# Patient Record
Sex: Female | Born: 1995 | Race: White | Hispanic: No | Marital: Single | Smoking: Never smoker
Health system: Southern US, Community
[De-identification: ages and names within clinical notes are randomized; demographics above are authoritative.]

## PROBLEM LIST (undated history)

## (undated) DIAGNOSIS — F319 Bipolar disorder, unspecified: Secondary | ICD-10-CM

## (undated) DIAGNOSIS — F329 Major depressive disorder, single episode, unspecified: Secondary | ICD-10-CM

## (undated) DIAGNOSIS — F32A Depression, unspecified: Secondary | ICD-10-CM

## (undated) DIAGNOSIS — F419 Anxiety disorder, unspecified: Secondary | ICD-10-CM

## (undated) HISTORY — PX: CHOLECYSTECTOMY: SHX55

## (undated) HISTORY — PX: TONSILLECTOMY: SUR1361

---
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
## 2019-10-18 ENCOUNTER — Encounter (HOSPITAL_COMMUNITY): Payer: Self-pay | Admitting: Emergency Medicine

## 2019-10-18 ENCOUNTER — Emergency Department (HOSPITAL_COMMUNITY): Payer: Medicaid Other

## 2019-10-18 ENCOUNTER — Emergency Department (HOSPITAL_COMMUNITY)
Admission: EM | Admit: 2019-10-18 | Discharge: 2019-10-19 | Disposition: A | Discharge status: Home / Self Care | Payer: Medicaid Other | Attending: Emergency Medicine | Admitting: Emergency Medicine

## 2019-10-18 ENCOUNTER — Other Ambulatory Visit: Payer: Self-pay

## 2019-10-18 DIAGNOSIS — Z20822 Contact with and (suspected) exposure to covid-19: Secondary | ICD-10-CM | POA: Insufficient documentation

## 2019-10-18 DIAGNOSIS — R079 Chest pain, unspecified: Secondary | ICD-10-CM | POA: Insufficient documentation

## 2019-10-18 DIAGNOSIS — R1084 Generalized abdominal pain: Secondary | ICD-10-CM | POA: Diagnosis present

## 2019-10-18 DIAGNOSIS — Z3201 Encounter for pregnancy test, result positive: Secondary | ICD-10-CM | POA: Diagnosis not present

## 2019-10-18 DIAGNOSIS — Z3490 Encounter for supervision of normal pregnancy, unspecified, unspecified trimester: Secondary | ICD-10-CM

## 2019-10-18 HISTORY — DX: Anxiety disorder, unspecified: F41.9

## 2019-10-18 HISTORY — DX: Bipolar disorder, unspecified: F31.9

## 2019-10-18 HISTORY — DX: Depression, unspecified: F32.A

## 2019-10-18 LAB — URINALYSIS, ROUTINE W REFLEX MICROSCOPIC
Bacteria, UA: NONE SEEN
Bilirubin Urine: NEGATIVE
Glucose, UA: NEGATIVE mg/dL
Hgb urine dipstick: NEGATIVE
Ketones, ur: NEGATIVE mg/dL
Nitrite: NEGATIVE
Protein, ur: NEGATIVE mg/dL
Specific Gravity, Urine: 1.016 (ref 1.005–1.030)
pH: 5 (ref 5.0–8.0)

## 2019-10-18 LAB — COMPREHENSIVE METABOLIC PANEL
ALT: 28 U/L (ref 0–44)
AST: 25 U/L (ref 15–41)
Albumin: 4.3 g/dL (ref 3.5–5.0)
Alkaline Phosphatase: 52 U/L (ref 38–126)
Anion gap: 11 (ref 5–15)
BUN: 9 mg/dL (ref 6–20)
CO2: 23 mmol/L (ref 22–32)
Calcium: 9.5 mg/dL (ref 8.9–10.3)
Chloride: 103 mmol/L (ref 98–111)
Creatinine, Ser: 0.82 mg/dL (ref 0.44–1.00)
GFR calc Af Amer: >60 mL/min (ref >60)
GFR calc non Af Amer: >60 mL/min (ref >60)
Glucose, Bld: 98 mg/dL (ref 70–99)
Potassium: 3.4 mmol/L — ABNORMAL LOW (ref 3.5–5.1)
Sodium: 137 mmol/L (ref 135–145)
Total Bilirubin: 0.6 mg/dL (ref 0.3–1.2)
Total Protein: 7.3 g/dL (ref 6.5–8.1)

## 2019-10-18 LAB — CBC
HCT: 42.5 % (ref 36.0–46.0)
Hemoglobin: 14 g/dL (ref 12.0–15.0)
MCH: 30.5 pg (ref 26.0–34.0)
MCHC: 32.9 g/dL (ref 30.0–36.0)
MCV: 92.6 fL (ref 80.0–100.0)
Platelets: 354 10*3/uL (ref 150–400)
RBC: 4.59 MIL/uL (ref 3.87–5.11)
RDW: 12.4 % (ref 11.5–15.5)
WBC: 12.4 10*3/uL — ABNORMAL HIGH (ref 4.0–10.5)
nRBC: 0 % (ref 0.0–0.2)

## 2019-10-18 LAB — TROPONIN I (HIGH SENSITIVITY)
Troponin I (High Sensitivity): 3 ng/L (ref <18)
Troponin I (High Sensitivity): <2 ng/L (ref <18)

## 2019-10-18 LAB — I-STAT BETA HCG BLOOD, ED (MC, WL, AP ONLY): I-stat hCG, quantitative: 57.4 m[IU]/mL — ABNORMAL HIGH (ref <5)

## 2019-10-18 LAB — LIPASE, BLOOD: Lipase: 28 U/L (ref 11–51)

## 2019-10-18 MED ORDER — SODIUM CHLORIDE 0.9% FLUSH: 3.0000 mL | Freq: Once | INTRAVENOUS | Status: DC

## 2019-10-18 NOTE — ED Triage Notes (Signed)
Patient reports central chest pain , mid abdominal pain , right low back pain , emesis , diarrhea and headache onset yesterday , denies SOB , no cough or fever .

## 2019-10-19 ENCOUNTER — Emergency Department (HOSPITAL_COMMUNITY): Payer: Medicaid Other

## 2019-10-19 LAB — HCG, QUANTITATIVE, PREGNANCY: hCG, Beta Chain, Quant, S: 69 m[IU]/mL — ABNORMAL HIGH (ref <5)

## 2019-10-19 LAB — POC SARS CORONAVIRUS 2 AG -  ED: SARS Coronavirus 2 Ag: NEGATIVE

## 2019-10-19 MED ORDER — SODIUM CHLORIDE 0.9 % IV BOLUS
1000.0000 mL | Freq: Once | INTRAVENOUS | Status: AC
  Administered 2019-10-19: 1000 mL via INTRAVENOUS

## 2019-10-19 MED ORDER — ACETAMINOPHEN 500 MG PO TABS
1000.0000 mg | ORAL_TABLET | Freq: Once | ORAL | Status: AC
  Administered 2019-10-19: 1000 mg via ORAL
  Filled 2019-10-19: qty 2

## 2019-10-19 NOTE — Discharge Instructions (Signed)
1. Medications: usual home medications, tylenol for body aches and fever control 2. Treatment: rest, drink plenty of fluids, 3. Follow Up: Please followup with your OB/GYN or the [facility] in 48 hours for repeat bloodwork; Please return to the ER within 24 hours if symptoms are not improving, your symptoms worsen in any way, you develop vaginal bleeding, persistent fevers, vomiting or other concerns.

## 2019-10-19 NOTE — ED Provider Notes (Signed)
[facility] EMERGENCY DEPARTMENT Provider Note   CSN: [id number] Arrival date & time: 10/18/19  1956     History Chief Complaint  Patient presents with  . Chest Pain  . Abdominal Pain    Victoria Salas is a 24 y.o. female with a hx of anxiety, bipolar disorder, depression presents to the Emergency Department complaining of gradual, persistent, progressively worsening generalized abd pain onset 4 days ago.  She reports the pain is severe, sharp and radiates into her right back.  Associated symptoms include low back pain, shortness of breath, headache, cough, chills.  She also reports a handful of episodes of nausea vomiting and diarrhea.  She reports emesis is nonbloody and nonbilious.  No melena or hematochezia.  Pt reports subjective fever at home up to 100.0 at home.  Pt reports taking Tylenol without relief.  Nothing makes her symptoms better or worse.  She denies known Covid contacts.  She has been tolerating p.o. at home without difficulty.  LMP: Dec7th.  Pt reports she previously had an IUD but it was removed on Dec 12th.  She reports she is sexually active without condom usage or other birth control.  She also reports missing her period in January.  Patient reports her primary concern is her abdominal pain.  She has no history of ectopic pregnancy.  Patient reports she is sexually active with one female partner and has no concern for STDs.  She denies vaginal discharge or vaginal bleeding.  Patient does have a history of cholecystectomy and tonsillectomy.  She denies neck pain, neck stiffness, weakness, dizziness, syncope, dysuria, hematuria, vaginal bleeding.   The history is provided by the patient and medical records. No language interpreter was used.       Past Medical History:  Diagnosis Date  . Anxiety   . Bipolar 1 disorder ([place])   . Depression     There are no problems to display for this patient.   Past Surgical History:  Procedure  Laterality Date  . CHOLECYSTECTOMY    . TONSILLECTOMY       OB History   No obstetric history on file.     No family history on file.  Social History   Tobacco Use  . Smoking status: Never Smoker  . Smokeless tobacco: Never Used  Substance Use Topics  . Alcohol use: Never  . Drug use: Never    Home Medications Prior to Admission medications   Not on File    Allergies    Patient has no known allergies.  Review of Systems   Review of Systems  Constitutional: Positive for appetite change, chills, fatigue and fever. Negative for diaphoresis and unexpected weight change.  HENT: Negative for mouth sores.   Eyes: Negative for visual disturbance.  Respiratory: Positive for cough and shortness of breath. Negative for chest tightness and wheezing.   Cardiovascular: Negative for chest pain.  Gastrointestinal: Positive for abdominal pain. Negative for constipation, diarrhea, nausea and vomiting.  Endocrine: Negative for polydipsia, polyphagia and polyuria.  Genitourinary: Negative for dysuria, frequency, hematuria and urgency.  Musculoskeletal: Positive for back pain. Negative for neck stiffness.  Skin: Negative for rash.  Allergic/Immunologic: Negative for immunocompromised state.  Neurological: Positive for headaches. Negative for syncope and light-headedness.  Hematological: Does not bruise/bleed easily.  Psychiatric/Behavioral: Negative for sleep disturbance. The patient is not nervous/anxious.     Physical Exam Updated Vital Signs BP 133/80   Pulse 99   Temp 98.4 F (36.9 C) (Oral)  Resp 19   LMP 08/28/2019 (Approximate)   SpO2 97%   Physical Exam Vitals and nursing note reviewed.  Constitutional:      General: She is not in acute distress.    Appearance: She is not diaphoretic.  HENT:     Head: Normocephalic.  Eyes:     General: No scleral icterus.    Conjunctiva/sclera: Conjunctivae normal.  Cardiovascular:     Rate and Rhythm: Regular rhythm.  Tachycardia present.     Pulses: Normal pulses.          Radial pulses are 2+ on the right side and 2+ on the left side.     Comments: Mild tachycardia Pulmonary:     Effort: No tachypnea, accessory muscle usage, prolonged expiration, respiratory distress or retractions.     Breath sounds: No stridor.     Comments: Equal chest rise. No increased work of breathing. Abdominal:     General: There is no distension.     Palpations: Abdomen is soft.     Tenderness: There is generalized abdominal tenderness. There is no right CVA tenderness, left CVA tenderness, guarding or rebound.  Musculoskeletal:     Cervical back: Normal range of motion.     Comments: Moves all extremities equally and without difficulty.  Skin:    General: Skin is warm and dry.     Capillary Refill: Capillary refill takes less than 2 seconds.     Comments: Warm to touch  Neurological:     Mental Status: She is alert.     GCS: GCS eye subscore is 4. GCS verbal subscore is 5. GCS motor subscore is 6.     Comments: Speech is clear and goal oriented.  Psychiatric:        Mood and Affect: Mood normal.     ED Results / Procedures / Treatments   Labs (all labs ordered are listed, but only abnormal results are displayed) Labs Reviewed  COMPREHENSIVE METABOLIC PANEL - Abnormal; Notable for the following components:      Result Value   Potassium 3.4 (*)    All other components within normal limits  CBC - Abnormal; Notable for the following components:   WBC 12.4 (*)    All other components within normal limits  URINALYSIS, ROUTINE W REFLEX MICROSCOPIC - Abnormal; Notable for the following components:   Leukocytes,Ua TRACE (*)    All other components within normal limits  I-STAT BETA HCG BLOOD, ED ([facility], [facility], [facility] ONLY) - Abnormal; Notable for the following components:   I-stat hCG, quantitative 57.4 (*)    All other components within normal limits  NOVEL CORONAVIRUS, NAA (HOSP ORDER, SEND-OUT TO REF LAB; TAT 18-24 HRS)    LIPASE, BLOOD  HCG, QUANTITATIVE, PREGNANCY  POC SARS CORONAVIRUS 2 AG -  ED  TROPONIN I (HIGH SENSITIVITY)  TROPONIN I (HIGH SENSITIVITY)    EKG EKG Interpretation  Date/Time:  Wednesday October 18 2019 20:05:52 EST Ventricular Rate:  116 PR Interval:  136 QRS Duration: 84 QT Interval:  308 QTC Calculation: 428 R Axis:   79 Text Interpretation: Sinus tachycardia Otherwise normal ECG No old tracing to compare Confirmed by Dione Booze ([id number]) on 10/18/2019 11:42:20 PM   Radiology DG Chest 2 View  Result Date: 10/18/2019 CLINICAL DATA:  Chest pain and abdominal pain x1 day. EXAM: CHEST - 2 VIEW COMPARISON:  January 12, 2019 FINDINGS: The heart size and mediastinal contours are within normal limits. Both lungs are clear. The visualized skeletal structures are unremarkable.  IMPRESSION: No active cardiopulmonary disease. Electronically Signed   By: Aram Candela M.D.   On: 10/18/2019 20:31   US OB Comp < 14 Wks  Result Date: 10/19/2019 CLINICAL DATA:  Lower abdominal pain in pregnancy, beta HCG 57, LMP 08/28/2019 EXAM: OBSTETRIC <14 WK [place] AND TRANSVAGINAL OB US TECHNIQUE: Both transabdominal and transvaginal ultrasound examinations were performed for complete evaluation of the gestation as well as the maternal uterus, adnexal regions, and pelvic cul-de-sac. Transvaginal technique was performed to assess early pregnancy. COMPARISON:  None. FINDINGS: Intrauterine gestational sac: None Yolk sac:  Not Visualized. Embryo:  Not Visualized. Cardiac Activity: Not Visualized. Maternal uterus/adnexae: No visible intrauterine fluid collection or gestational sac is visualized. There is a 1.3 cm cystic structure in the left adnexa without visible yolk sac or embryo. Possibly reflecting a normal follicle. No right adnexal lesions. There is a moderate volume low-attenuation free fluid in the pelvis. IMPRESSION: No intrauterine pregnancy visualized. A 1.3 cm cystic structures present in the left adnexa but  without visualized yolk sac or embryo to reliably suggest an ectopic at this location. Moderate volume free fluid is seen. Differential considerations would include early intrauterine pregnancy too early to visualize, spontaneous abortion, or occult ectopic elsewhere in the pelvis. Recommend close clinical followup and serial quantitative beta HCGs and ultrasounds. Electronically Signed   By: Kreg Shropshire M.D.   On: 10/19/2019 03:42   US OB Transvaginal  Result Date: 10/19/2019 CLINICAL DATA:  Lower abdominal pain in pregnancy, beta HCG 57, LMP 08/28/2019 EXAM: OBSTETRIC <14 WK [place] AND TRANSVAGINAL OB US TECHNIQUE: Both transabdominal and transvaginal ultrasound examinations were performed for complete evaluation of the gestation as well as the maternal uterus, adnexal regions, and pelvic cul-de-sac. Transvaginal technique was performed to assess early pregnancy. COMPARISON:  None. FINDINGS: Intrauterine gestational sac: None Yolk sac:  Not Visualized. Embryo:  Not Visualized. Cardiac Activity: Not Visualized. Maternal uterus/adnexae: No visible intrauterine fluid collection or gestational sac is visualized. There is a 1.3 cm cystic structure in the left adnexa without visible yolk sac or embryo. Possibly reflecting a normal follicle. No right adnexal lesions. There is a moderate volume low-attenuation free fluid in the pelvis. IMPRESSION: No intrauterine pregnancy visualized. A 1.3 cm cystic structures present in the left adnexa but without visualized yolk sac or embryo to reliably suggest an ectopic at this location. Moderate volume free fluid is seen. Differential considerations would include early intrauterine pregnancy too early to visualize, spontaneous abortion, or occult ectopic elsewhere in the pelvis. Recommend close clinical followup and serial quantitative beta HCGs and ultrasounds. Electronically Signed   By: Kreg Shropshire M.D.   On: 10/19/2019 03:42    Procedures Procedures (including critical  care time)  Medications Ordered in ED Medications  sodium chloride flush (NS) 0.9 % injection 3 mL (has no administration in time range)  sodium chloride 0.9 % bolus 1,000 mL (0 mLs Intravenous Stopped 10/19/19 0232)  acetaminophen (TYLENOL) tablet 1,000 mg (1,000 mg Oral Given 10/19/19 5284)    ED Course  I have reviewed the triage vital signs and the nursing notes.  Pertinent labs & imaging results that were available during my care of the patient were reviewed by me and considered in my medical decision making (see chart for details).    MDM Rules/Calculators/A&P                       Duaa Stelzner was evaluated in Emergency Department on 10/19/2019 for the symptoms  described in the history of present illness. She was evaluated in the context of the global COVID-19 pandemic, which necessitated consideration that the patient might be at risk for infection with the SARS-CoV-2 virus that causes COVID-19. Institutional protocols and algorithms that pertain to the evaluation of patients at risk for COVID-19 are in a state of rapid change based on information released by regulatory bodies including the CDC and federal and state organizations. These policies and algorithms were followed during the patient's care in the ED.  Patient presents with Covid-like illness.  She is warm to touch.  Mild tachycardia but no hypotension, hypoxia or tachypnea.  Afebrile here in the emergency room.  No difficulty breathing, respiratory distress, accessory muscle usage.  Generalized abdominal tenderness on exam without focal/localized tenderness.  Labs are largely reassuring.  Patient does have mild leukocytosis of 12.4.   UA with evidence of urinary tract infection.  No flank pain on exam.  Covid antigen test negative; PCR sent.   Additionally positive pregnancy test.  Very low quant.  Ultrasound does not show pregnancy location.  Patient will need 48-hour repeat quant for further evaluation of  potential ectopic pregnancy.  Patient given Tylenol and fluids with significant improvement in vital signs.  Tachycardia resolved.  She remains without hypotension or hypoxia.  On repeat exam patient abdomen remains soft without rebound or guarding.  No focal tenderness.  She reports significant improvement in pain after Tylenol administration.  No vomiting here in the emergency department.  Discussed potential for Covid, ectopic pregnancy, early appendicitis or other potential intra-abdominal infection.  Patient reports she is feeling better and does wish to go home.  She does not wish to stay for any further monitoring.  Her repeat exam is reassuring.  Strict return precautions given.  She is to return to the emergency department within 24 hours if she is not improved or her symptoms worsen in any way.  She is to be evaluated by her OB/GYN, outpatient women's clinic or [facility] within 48 hours for repeat quant.  Patient states understanding and is in agreement with this plan.  Final Clinical Impression(s) / ED Diagnoses Final diagnoses:  Suspected COVID-19 virus infection  Generalized abdominal pain  Pregnancy, unspecified gestational age    Rx / DC Orders ED Discharge Orders    None       Mart Colpitts, Boyd Kerbs 10/19/19 [id number]    Dione Booze, MD 10/19/19 [phone]

## 2019-10-20 LAB — NOVEL CORONAVIRUS, NAA (HOSP ORDER, SEND-OUT TO REF LAB; TAT 18-24 HRS): SARS-CoV-2, NAA: NOT DETECTED

## 2020-06-19 IMAGING — CR DG CHEST 2V
2 series · 2 of 2 positions shown · non-contrast
Comparison: January 12, 2019

CLINICAL DATA: Chest pain and abdominal pain x1 day.

EXAM:
CHEST - 2 VIEW

[chest pa]
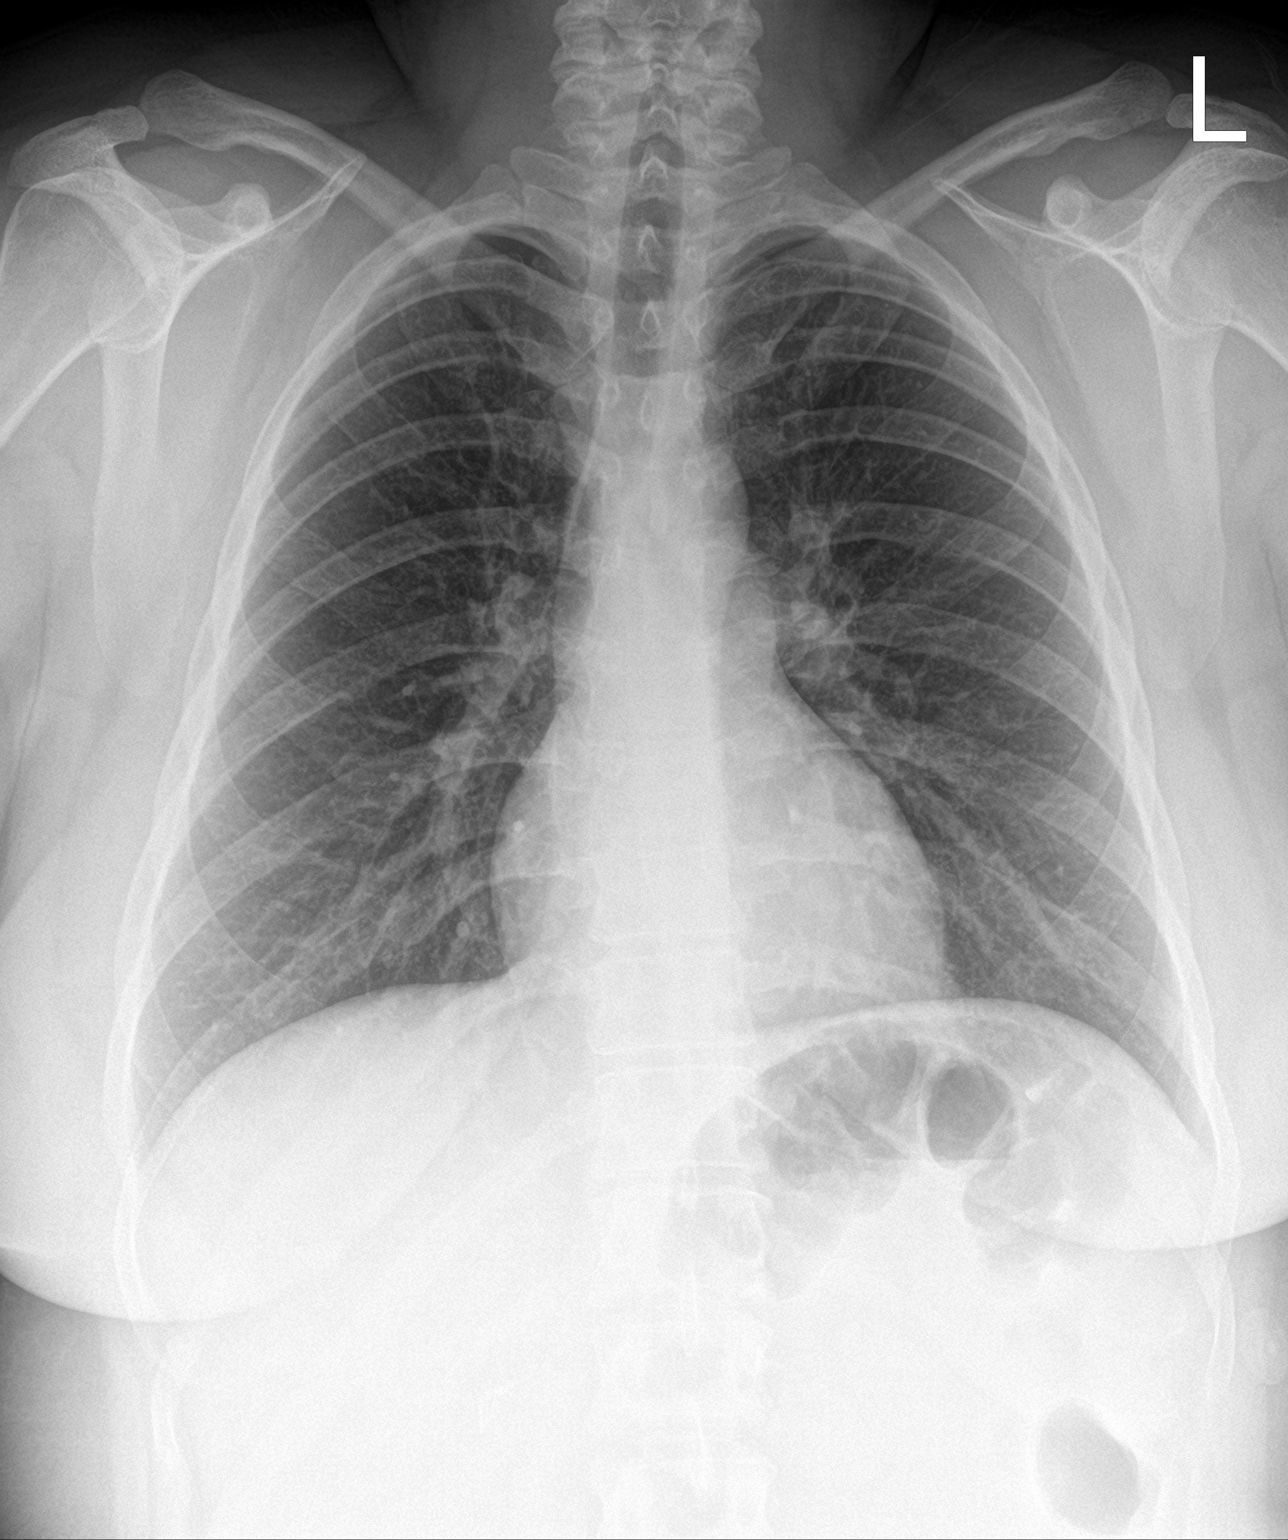

[chest lat]
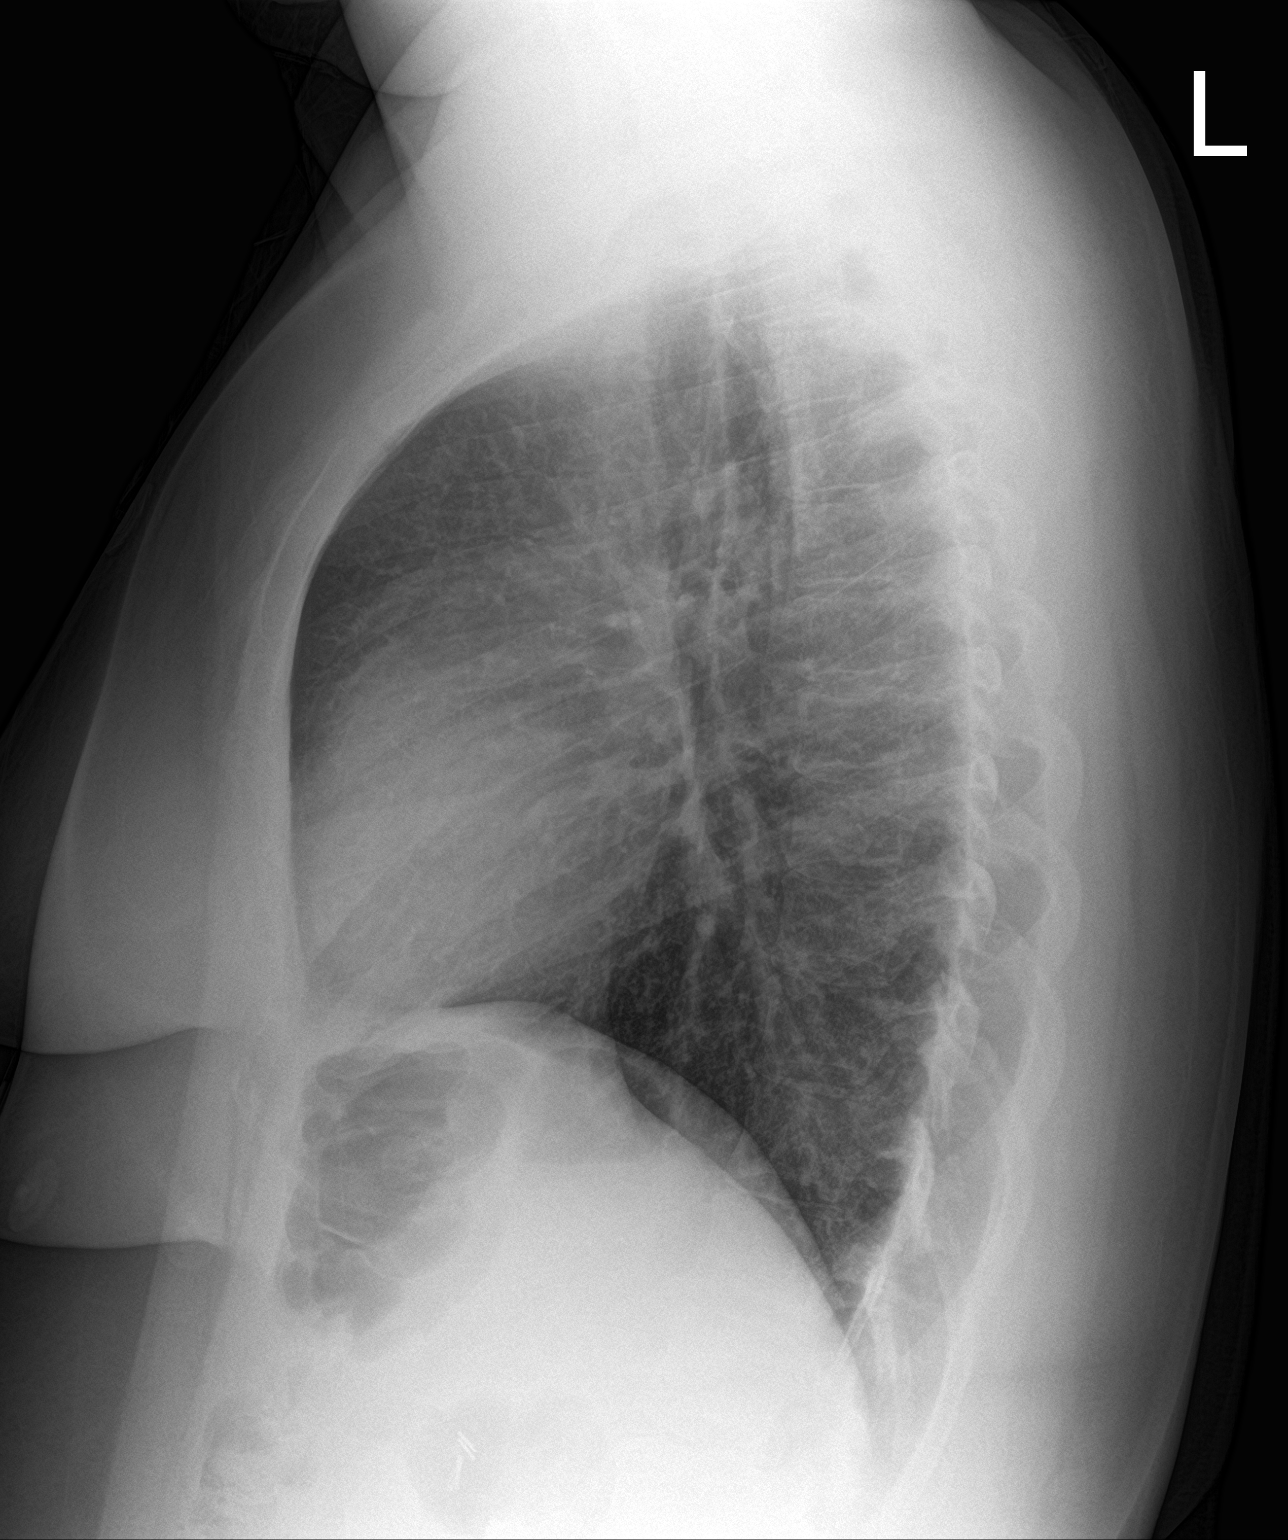

[2 of 2 positions shown; findings below may reference images not displayed]

FINDINGS: The heart size and mediastinal contours are within normal limits.
Both lungs are clear. The visualized skeletal structures are
unremarkable.
IMPRESSION: No active cardiopulmonary disease.

## 2020-06-20 IMAGING — US US OB TRANSVAGINAL
1 series · 13 of 28 positions shown · non-contrast
Comparison: None.

CLINICAL DATA: Lower abdominal pain in pregnancy, beta HCG 57, LMP
08/28/2019

EXAM:
OBSTETRIC <14 WK US AND TRANSVAGINAL OB US
TECHNIQUE: Both transabdominal and transvaginal ultrasound examinations were
performed for complete evaluation of the gestation as well as the
maternal uterus, adnexal regions, and pelvic cul-de-sac.
Transvaginal technique was performed to assess early pregnancy.

[Series 1: us ob transvaginal · 112 acquisitions, 13 frames shown]
[im 5/112]
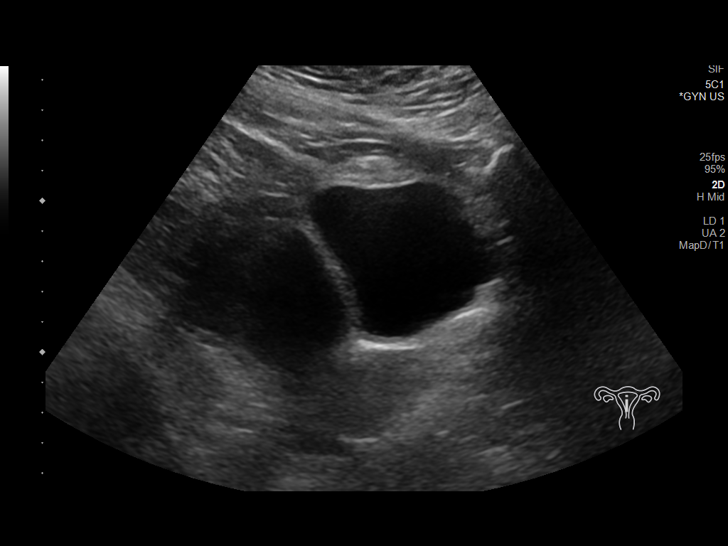
[im 13/112]
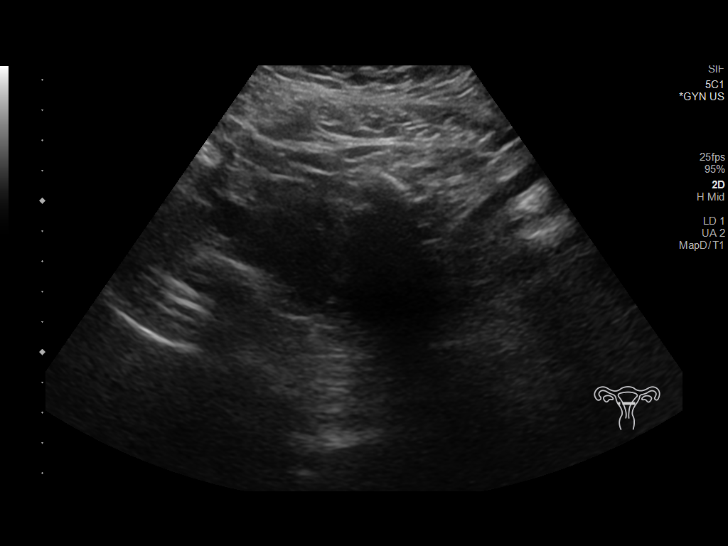
[im 21/112]
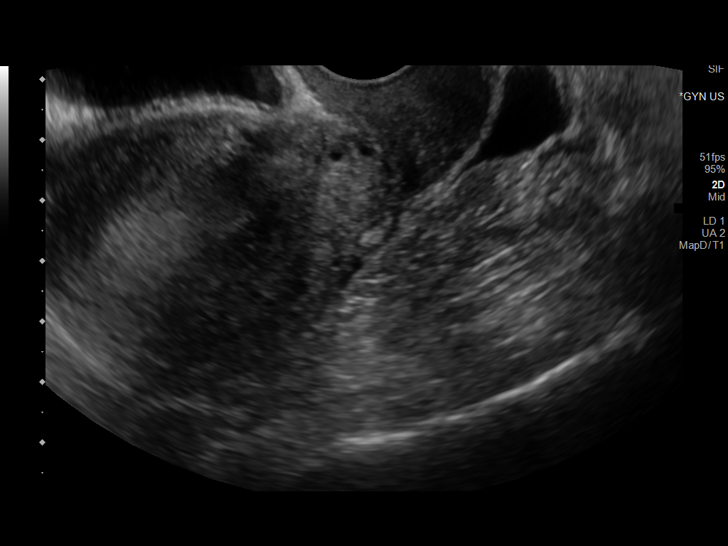
[im 29/112]
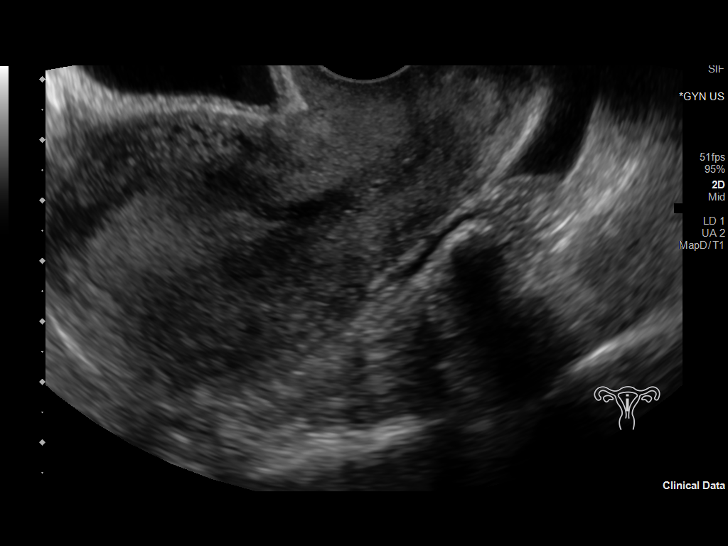
[im 38/112]
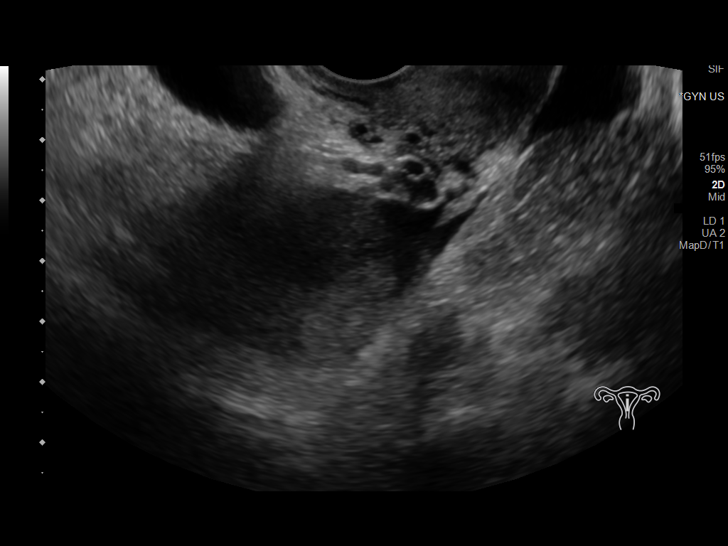
[im 46/112]
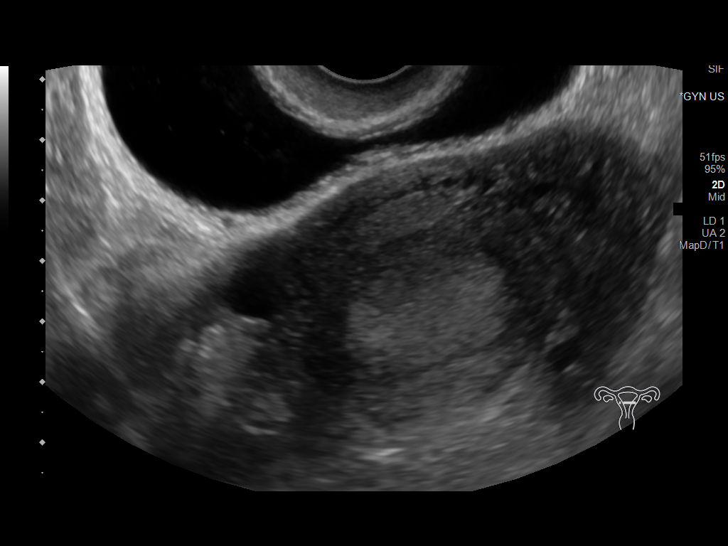
[im 58/112]
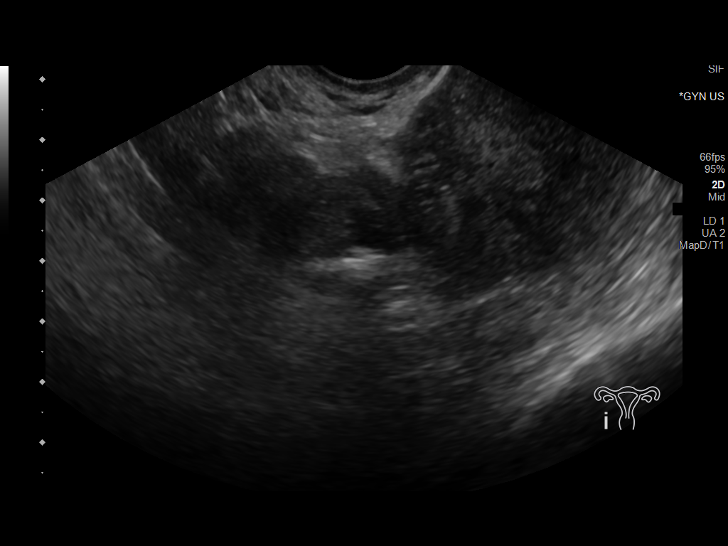
[im 66/112]
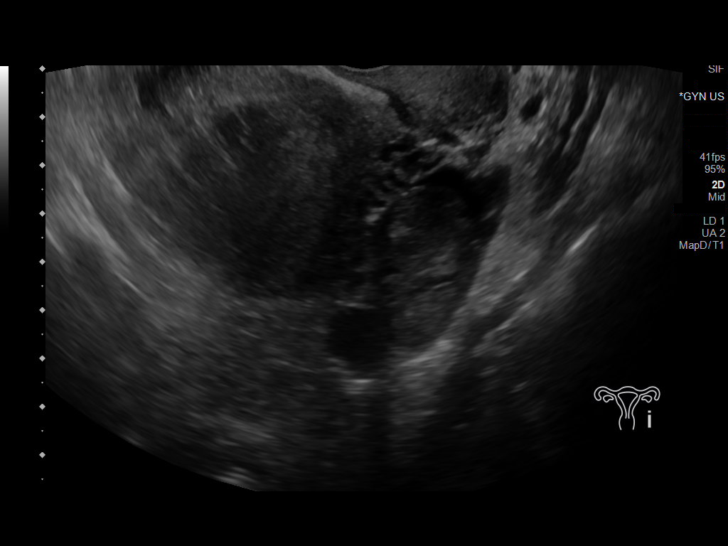
[im 75/112]
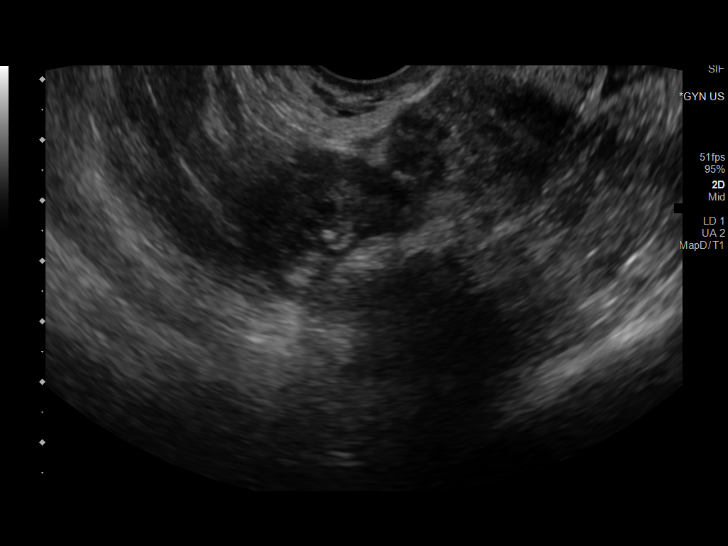
[im 83/112]
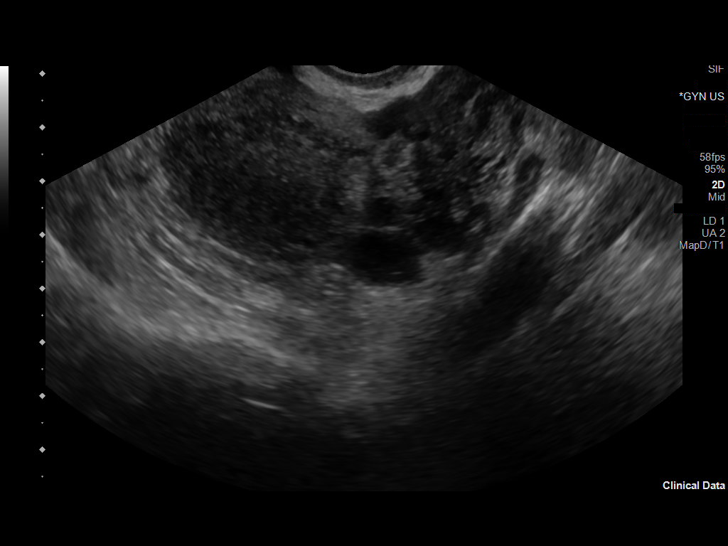
[im 91/112]
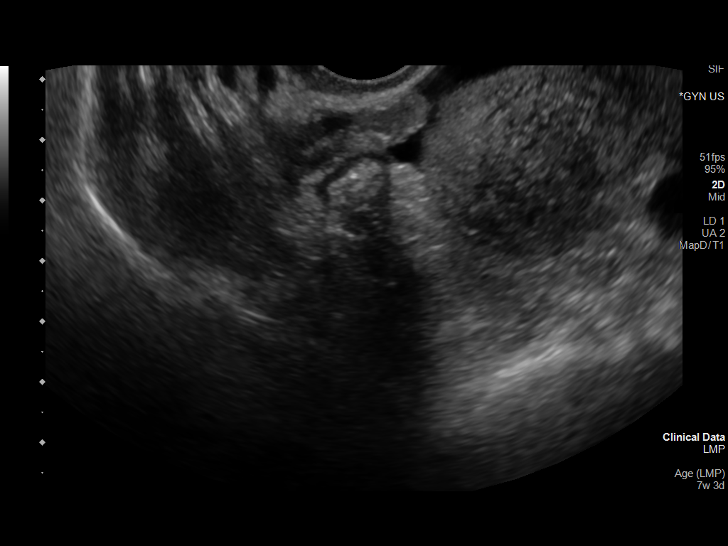
[im 99/112]
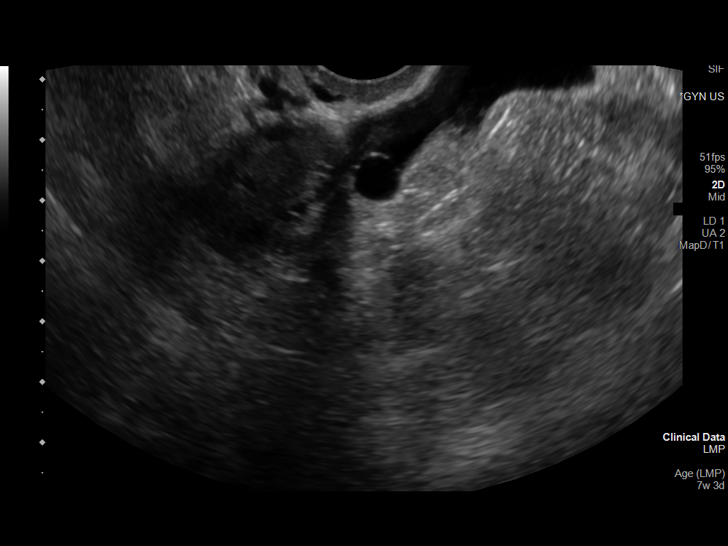
[im 107/112]
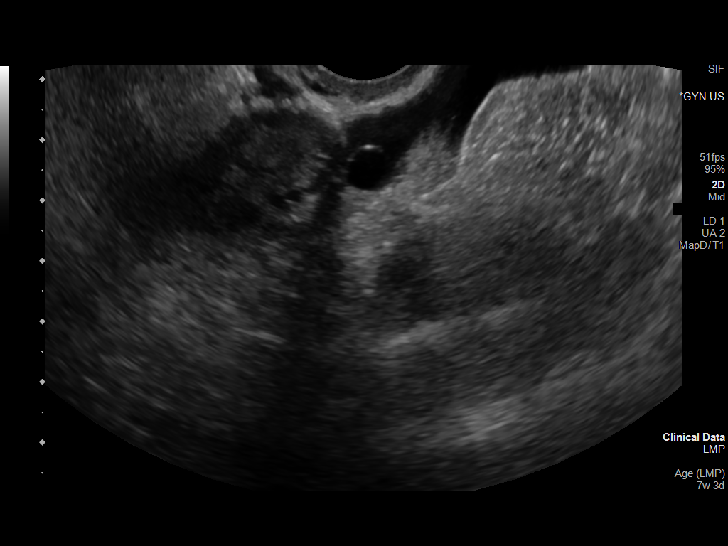

[13 of 28 positions shown; findings below may reference images not displayed]

FINDINGS: Intrauterine gestational sac: None

Yolk sac:  Not Visualized.

Embryo:  Not Visualized.

Cardiac Activity: Not Visualized.

Maternal uterus/adnexae: No visible intrauterine fluid collection or
gestational sac is visualized. There is a 1.3 cm cystic structure in
the left adnexa without visible yolk sac or embryo. Possibly
reflecting a normal follicle. No right adnexal lesions. There is a
moderate volume low-attenuation free fluid in the pelvis.
IMPRESSION: No intrauterine pregnancy visualized. A 1.3 cm cystic structures
present in the left adnexa but without visualized yolk sac or embryo
to reliably suggest an ectopic at this location. Moderate volume
free fluid is seen. Differential considerations would include early
intrauterine pregnancy too early to visualize, spontaneous abortion,
or occult ectopic elsewhere in the pelvis. Recommend close clinical
followup and serial quantitative beta HCGs and ultrasounds.
